# Patient Record
Sex: Female | Born: 1965 | Race: White | Hispanic: No | Marital: Single | Smoking: Never smoker
Health system: Southern US, Community
[De-identification: ages and names within clinical notes are randomized; demographics above are authoritative.]

## PROBLEM LIST (undated history)

## (undated) DIAGNOSIS — K529 Noninfective gastroenteritis and colitis, unspecified: Secondary | ICD-10-CM

## (undated) DIAGNOSIS — E785 Hyperlipidemia, unspecified: Secondary | ICD-10-CM

## (undated) DIAGNOSIS — E079 Disorder of thyroid, unspecified: Secondary | ICD-10-CM

## (undated) DIAGNOSIS — T7840XA Allergy, unspecified, initial encounter: Secondary | ICD-10-CM

## (undated) DIAGNOSIS — D649 Anemia, unspecified: Secondary | ICD-10-CM

## (undated) HISTORY — DX: Hyperlipidemia, unspecified: E78.5

## (undated) HISTORY — DX: Allergy, unspecified, initial encounter: T78.40XA

## (undated) HISTORY — DX: Disorder of thyroid, unspecified: E07.9

## (undated) HISTORY — PX: IMAGE GUIDED SINUS SURGERY: SHX6570

## (undated) HISTORY — DX: Anemia, unspecified: D64.9

## (undated) HISTORY — DX: Noninfective gastroenteritis and colitis, unspecified: K52.9

---

## 2004-11-09 ENCOUNTER — Ambulatory Visit: Payer: Self-pay | Admitting: Unknown Physician Specialty

## 2005-11-15 ENCOUNTER — Ambulatory Visit: Payer: Self-pay | Admitting: Unknown Physician Specialty

## 2006-12-09 ENCOUNTER — Ambulatory Visit: Payer: Self-pay | Admitting: Unknown Physician Specialty

## 2007-02-19 ENCOUNTER — Ambulatory Visit: Payer: Self-pay | Admitting: Family Medicine

## 2007-12-15 ENCOUNTER — Ambulatory Visit: Payer: Self-pay | Admitting: Unknown Physician Specialty

## 2008-08-16 ENCOUNTER — Ambulatory Visit: Payer: Self-pay | Admitting: Internal Medicine

## 2009-04-22 ENCOUNTER — Ambulatory Visit: Payer: Self-pay | Admitting: Unknown Physician Specialty

## 2010-08-24 ENCOUNTER — Ambulatory Visit: Payer: Self-pay | Admitting: Unknown Physician Specialty

## 2010-09-04 ENCOUNTER — Ambulatory Visit: Payer: Self-pay | Admitting: Unknown Physician Specialty

## 2011-12-26 ENCOUNTER — Ambulatory Visit: Payer: Self-pay

## 2013-08-22 ENCOUNTER — Ambulatory Visit: Payer: Self-pay | Admitting: Physician Assistant

## 2013-12-11 ENCOUNTER — Emergency Department: Payer: Self-pay | Admitting: Emergency Medicine

## 2013-12-25 DIAGNOSIS — J302 Other seasonal allergic rhinitis: Secondary | ICD-10-CM | POA: Insufficient documentation

## 2014-01-29 DIAGNOSIS — E039 Hypothyroidism, unspecified: Secondary | ICD-10-CM | POA: Insufficient documentation

## 2014-01-29 DIAGNOSIS — E7800 Pure hypercholesterolemia, unspecified: Secondary | ICD-10-CM | POA: Insufficient documentation

## 2014-02-26 ENCOUNTER — Emergency Department: Payer: Self-pay | Admitting: Emergency Medicine

## 2014-02-26 LAB — BASIC METABOLIC PANEL
Anion Gap: 1 — ABNORMAL LOW (ref 7–16)
BUN: 15 mg/dL (ref 7–18)
CALCIUM: 8.9 mg/dL (ref 8.5–10.1)
CO2: 26 mmol/L (ref 21–32)
Chloride: 110 mmol/L — ABNORMAL HIGH (ref 98–107)
Creatinine: 0.85 mg/dL (ref 0.60–1.30)
EGFR (Non-African Amer.): >60
Glucose: 86 mg/dL (ref 65–99)
Osmolality: 274 (ref 275–301)
POTASSIUM: 3.6 mmol/L (ref 3.5–5.1)
Sodium: 137 mmol/L (ref 136–145)

## 2014-02-26 LAB — CBC
HCT: 40.2 % (ref 35.0–47.0)
HGB: 13.4 g/dL (ref 12.0–16.0)
MCH: 31.1 pg (ref 26.0–34.0)
MCHC: 33.3 g/dL (ref 32.0–36.0)
MCV: 94 fL (ref 80–100)
Platelet: 289 10*3/uL (ref 150–440)
RBC: 4.3 10*6/uL (ref 3.80–5.20)
RDW: 13.1 % (ref 11.5–14.5)
WBC: 10.5 10*3/uL (ref 3.6–11.0)

## 2014-02-26 LAB — TROPONIN I
Troponin-I: <0.02 ng/mL
Troponin-I: <0.02 ng/mL

## 2014-02-26 LAB — D-DIMER(ARMC): D-DIMER: 1668 ng/mL

## 2015-02-09 ENCOUNTER — Other Ambulatory Visit: Payer: Self-pay | Admitting: Certified Nurse Midwife

## 2015-02-09 DIAGNOSIS — Z1231 Encounter for screening mammogram for malignant neoplasm of breast: Secondary | ICD-10-CM

## 2015-02-10 ENCOUNTER — Encounter: Payer: Self-pay | Admitting: *Deleted

## 2015-03-02 ENCOUNTER — Ambulatory Visit: Payer: Self-pay

## 2015-03-03 ENCOUNTER — Ambulatory Visit
Admission: RE | Admit: 2015-03-03 | Discharge: 2015-03-03 | Disposition: A | Discharge status: Home / Self Care | Payer: BC Managed Care – PPO | Source: Ambulatory Visit | Attending: Certified Nurse Midwife | Admitting: Certified Nurse Midwife

## 2015-03-03 DIAGNOSIS — Z1231 Encounter for screening mammogram for malignant neoplasm of breast: Secondary | ICD-10-CM | POA: Insufficient documentation

## 2015-03-09 ENCOUNTER — Ambulatory Visit: Payer: Self-pay | Admitting: General Surgery

## 2015-03-09 ENCOUNTER — Ambulatory Visit (INDEPENDENT_AMBULATORY_CARE_PROVIDER_SITE_OTHER): Payer: BC Managed Care – PPO | Admitting: General Surgery

## 2015-03-09 ENCOUNTER — Encounter: Payer: Self-pay | Admitting: General Surgery

## 2015-03-09 VITALS — BP 144/88 | HR 84 | Resp 16 | Ht 64.0 in | Wt 139.0 lb

## 2015-03-09 DIAGNOSIS — N6001 Solitary cyst of right breast: Secondary | ICD-10-CM

## 2015-03-09 DIAGNOSIS — N644 Mastodynia: Secondary | ICD-10-CM | POA: Diagnosis not present

## 2015-03-09 DIAGNOSIS — N6009 Solitary cyst of unspecified breast: Secondary | ICD-10-CM | POA: Insufficient documentation
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
## 2015-03-09 NOTE — Patient Instructions (Addendum)
The patient is aware to call back for any questions or concerns. Breast Self-Awareness Practicing breast self-awareness may pick up problems early, prevent significant medical complications, and possibly save your life. By practicing breast self-awareness, you can become familiar with how your breasts look and feel and if your breasts are changing. This allows you to notice changes early. It can also offer you some reassurance that your breast health is good. One way to learn what is normal for your breasts and whether your breasts are changing is to do a breast self-exam. If you find a lump or something that was not present in the past, it is best to contact your caregiver right away. Other findings that should be evaluated by your caregiver include nipple discharge, especially if it is bloody; skin changes or reddening; areas where the skin seems to be pulled in (retracted); or new lumps and bumps. Breast pain is seldom associated with cancer (malignancy), but should also be evaluated by a caregiver. HOW TO PERFORM A BREAST SELF-EXAM The best time to examine your breasts is 5-7 days after your menstrual period is over. During menstruation, the breasts are lumpier, and it may be more difficult to pick up changes. If you do not menstruate, have reached menopause, or had your uterus removed (hysterectomy), you should examine your breasts at regular intervals, such as monthly. If you are breastfeeding, examine your breasts after a feeding or after using a breast pump. Breast implants do not decrease the risk for lumps or tumors, so continue to perform breast self-exams as recommended. Talk to your caregiver about how to determine the difference between the implant and breast tissue. Also, talk about the amount of pressure you should use during the exam. Over time, you will become more familiar with the variations of your breasts and more comfortable with the exam. A breast self-exam requires you to remove all  your clothes above the waist. 1. Look at your breasts and nipples. Stand in front of a mirror in a room with good lighting. With your hands on your hips, push your hands firmly downward. Look for a difference in shape, contour, and size from one breast to the other (asymmetry). Asymmetry includes puckers, dips, or bumps. Also, look for skin changes, such as reddened or scaly areas on the breasts. Look for nipple changes, such as discharge, dimpling, repositioning, or redness. 2. Carefully feel your breasts. This is best done either in the shower or tub while using soapy water or when flat on your back. Place the arm (on the side of the breast you are examining) above your head. Use the pads (not the fingertips) of your three middle fingers on your opposite hand to feel your breasts. Start in the underarm area and use  inch (2 cm) overlapping circles to feel your breast. Use 3 different levels of pressure (light, medium, and firm pressure) at each circle before moving to the next circle. The light pressure is needed to feel the tissue closest to the skin. The medium pressure will help to feel breast tissue a little deeper, while the firm pressure is needed to feel the tissue close to the ribs. Continue the overlapping circles, moving downward over the breast until you feel your ribs below your breast. Then, move one finger-width towards the center of the body. Continue to use the  inch (2 cm) overlapping circles to feel your breast as you move slowly up toward the collar bone (clavicle) near the base of the neck. Continue the   up and down exam using all 3 pressures until you reach the middle of the chest. Do this with each breast, carefully feeling for lumps or changes. 3.  Keep a written record with breast changes or normal findings for each breast. By writing this information down, you do not need to depend only on memory for size, tenderness, or location. Write down where you are in your menstrual cycle, if  you are still menstruating. Breast tissue can have some lumps or thick tissue. However, see your caregiver if you find anything that concerns you.  SEEK MEDICAL CARE IF:  You see a change in shape, contour, or size of your breasts or nipples.   You see skin changes, such as reddened or scaly areas on the breasts or nipples.   You have an unusual discharge from your nipples.   You feel a new lump or unusually thick areas.    This information is not intended to replace advice given to you by your health care provider. Make sure you discuss any questions you have with your health care provider.   Document Released: 02/12/2005 Document Revised: 01/30/2012 Document Reviewed: 05/30/2011 Elsevier Interactive Patient Education 2016 Elsevier Inc.  

## 2016-04-18 DIAGNOSIS — R0982 Postnasal drip: Secondary | ICD-10-CM | POA: Insufficient documentation

## 2016-04-18 DIAGNOSIS — R059 Cough, unspecified: Secondary | ICD-10-CM | POA: Insufficient documentation

## 2016-08-16 DIAGNOSIS — I10 Essential (primary) hypertension: Secondary | ICD-10-CM | POA: Insufficient documentation

## 2016-08-16 DIAGNOSIS — R002 Palpitations: Secondary | ICD-10-CM | POA: Insufficient documentation

## 2016-11-27 DIAGNOSIS — J324 Chronic pansinusitis: Secondary | ICD-10-CM | POA: Insufficient documentation

## 2016-11-27 DIAGNOSIS — J45902 Unspecified asthma with status asthmaticus: Secondary | ICD-10-CM | POA: Insufficient documentation

## 2017-06-27 ENCOUNTER — Other Ambulatory Visit: Payer: Self-pay | Admitting: Family Medicine

## 2017-06-27 DIAGNOSIS — Z803 Family history of malignant neoplasm of breast: Secondary | ICD-10-CM

## 2017-06-27 DIAGNOSIS — Z1239 Encounter for other screening for malignant neoplasm of breast: Secondary | ICD-10-CM

## 2018-01-21 ENCOUNTER — Other Ambulatory Visit: Payer: Self-pay | Admitting: Family Medicine

## 2018-01-21 DIAGNOSIS — Z1231 Encounter for screening mammogram for malignant neoplasm of breast: Secondary | ICD-10-CM

## 2018-12-09 ENCOUNTER — Other Ambulatory Visit: Payer: Self-pay | Admitting: Family Medicine

## 2019-01-08 ENCOUNTER — Ambulatory Visit: Payer: BC Managed Care – PPO

## 2019-01-21 ENCOUNTER — Other Ambulatory Visit: Payer: Self-pay

## 2019-01-21 ENCOUNTER — Encounter (INDEPENDENT_AMBULATORY_CARE_PROVIDER_SITE_OTHER): Payer: Self-pay

## 2019-01-21 ENCOUNTER — Ambulatory Visit
Admission: RE | Admit: 2019-01-21 | Discharge: 2019-01-21 | Disposition: A | Discharge status: Home / Self Care | Payer: BC Managed Care – PPO | Source: Ambulatory Visit | Attending: Family Medicine | Admitting: Family Medicine

## 2019-01-21 DIAGNOSIS — Z1231 Encounter for screening mammogram for malignant neoplasm of breast: Secondary | ICD-10-CM | POA: Diagnosis present

## 2019-12-11 ENCOUNTER — Other Ambulatory Visit: Payer: Self-pay | Admitting: Family Medicine

## 2019-12-11 DIAGNOSIS — Z1231 Encounter for screening mammogram for malignant neoplasm of breast: Secondary | ICD-10-CM

## 2020-01-27 ENCOUNTER — Ambulatory Visit
Admission: RE | Admit: 2020-01-27 | Discharge: 2020-01-27 | Disposition: A | Discharge status: Home / Self Care | Payer: BC Managed Care – PPO | Source: Ambulatory Visit | Attending: Family Medicine | Admitting: Family Medicine

## 2020-01-27 ENCOUNTER — Other Ambulatory Visit: Payer: Self-pay

## 2020-01-27 DIAGNOSIS — Z1231 Encounter for screening mammogram for malignant neoplasm of breast: Secondary | ICD-10-CM | POA: Diagnosis not present

## 2020-02-04 ENCOUNTER — Other Ambulatory Visit: Payer: Self-pay | Admitting: Family Medicine

## 2020-02-04 DIAGNOSIS — R921 Mammographic calcification found on diagnostic imaging of breast: Secondary | ICD-10-CM

## 2020-02-04 DIAGNOSIS — R928 Other abnormal and inconclusive findings on diagnostic imaging of breast: Secondary | ICD-10-CM

## 2020-02-16 ENCOUNTER — Ambulatory Visit
Admission: RE | Admit: 2020-02-16 | Discharge: 2020-02-16 | Disposition: A | Discharge status: Home / Self Care | Payer: BC Managed Care – PPO | Source: Ambulatory Visit | Attending: Family Medicine | Admitting: Family Medicine

## 2020-02-16 ENCOUNTER — Other Ambulatory Visit: Payer: Self-pay

## 2020-02-16 DIAGNOSIS — R928 Other abnormal and inconclusive findings on diagnostic imaging of breast: Secondary | ICD-10-CM

## 2020-02-16 DIAGNOSIS — R921 Mammographic calcification found on diagnostic imaging of breast: Secondary | ICD-10-CM

## 2020-03-07 ENCOUNTER — Other Ambulatory Visit: Payer: Self-pay | Admitting: Family Medicine

## 2020-03-07 DIAGNOSIS — R928 Other abnormal and inconclusive findings on diagnostic imaging of breast: Secondary | ICD-10-CM

## 2020-03-07 DIAGNOSIS — R921 Mammographic calcification found on diagnostic imaging of breast: Secondary | ICD-10-CM

## 2020-03-30 ENCOUNTER — Ambulatory Visit: Payer: BC Managed Care – PPO

## 2020-04-05 ENCOUNTER — Other Ambulatory Visit: Payer: Self-pay

## 2020-04-05 ENCOUNTER — Ambulatory Visit
Admission: RE | Admit: 2020-04-05 | Discharge: 2020-04-05 | Disposition: A | Discharge status: Home / Self Care | Payer: BC Managed Care – PPO | Source: Ambulatory Visit | Attending: Family Medicine | Admitting: Family Medicine

## 2020-04-05 DIAGNOSIS — R921 Mammographic calcification found on diagnostic imaging of breast: Secondary | ICD-10-CM | POA: Diagnosis present

## 2020-04-05 DIAGNOSIS — R928 Other abnormal and inconclusive findings on diagnostic imaging of breast: Secondary | ICD-10-CM | POA: Insufficient documentation

## 2020-04-05 HISTORY — PX: BREAST BIOPSY: SHX20

## 2020-04-07 LAB — SURGICAL PATHOLOGY

## 2020-05-03 DIAGNOSIS — N6092 Unspecified benign mammary dysplasia of left breast: Secondary | ICD-10-CM | POA: Insufficient documentation

## 2020-05-23 ENCOUNTER — Ambulatory Visit: Payer: BC Managed Care – PPO | Admitting: Gastroenterology

## 2020-05-23 ENCOUNTER — Other Ambulatory Visit: Payer: Self-pay

## 2020-05-23 ENCOUNTER — Encounter: Payer: Self-pay | Admitting: Gastroenterology

## 2020-05-23 VITALS — BP 131/88 | HR 84 | Temp 98.1°F | Ht 64.0 in | Wt 116.1 lb

## 2020-05-23 DIAGNOSIS — K5909 Other constipation: Secondary | ICD-10-CM

## 2020-05-23 DIAGNOSIS — R194 Change in bowel habit: Secondary | ICD-10-CM

## 2020-05-23 MED ORDER — MAGNESIUM CITRATE PO SOLN: 1.0000 | Freq: Once | ORAL | 0 refills | Status: AC

## 2020-05-23 MED ORDER — CLENPIQ 10-3.5-12 MG-GM -GM/160ML PO SOLN: 320.0000 mL | Freq: Once | ORAL | 0 refills | Status: AC
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
## 2020-07-01 ENCOUNTER — Other Ambulatory Visit: Payer: Self-pay

## 2020-07-01 ENCOUNTER — Encounter: Payer: Self-pay | Admitting: Gastroenterology

## 2020-07-04 ENCOUNTER — Encounter: Payer: Self-pay | Admitting: Anesthesiology

## 2020-07-06 ENCOUNTER — Ambulatory Visit: Payer: BC Managed Care – PPO | Admitting: Gastroenterology

## 2020-07-12 ENCOUNTER — Telehealth: Payer: Self-pay | Admitting: Gastroenterology

## 2020-07-12 ENCOUNTER — Telehealth: Payer: Self-pay

## 2020-07-12 NOTE — Telephone Encounter (Signed)
Tried to call patient 4 times but it said call can not be completed at this time please hang up and try again later. Patient is not set up for mychart so unable to send a [employer]. Please advised on prep

## 2020-07-12 NOTE — Telephone Encounter (Signed)
Kim with the ENDO unit in [facility] sent me a message though epic stating the patient called there to cancel her colonoscopy for 07/14/2020. She states that she has left 3 messages for our office but no one has called her back. This is the first message I have got for her. Documented in referral

## 2020-07-12 NOTE — Telephone Encounter (Signed)
Patient lvm that she is having muscle spasms. She just started taking tamoxifen (NOLVADEX) 10 MG tablet and/or low sodium and is worried about bowel prepping for her colonoscopy. Please advise patient.

## 2020-07-12 NOTE — Telephone Encounter (Signed)
I see that her sodium levels were normal in the past.  So, I do not anticipate any issues with her bowel prep.  Advise her to stay hydrated with Gatorade and lots of fluids during bowel prep  RV

## 2020-07-12 NOTE — Telephone Encounter (Signed)
Patient states her oncologist Is going to order some lab work and she is going to make sure everything is normal. They think it is just a side effect to the medication. She states she will give [place] a call back when she decides to rescheduled the procedure

## 2020-07-12 NOTE — Telephone Encounter (Signed)
Have documented this in another telephone call

## 2020-07-12 NOTE — Telephone Encounter (Signed)
Per [place] Patient lvm that she is having muscle spasms. She just started taking tamoxifen (NOLVADEX) 10 MG tablet and/or low sodium and is worried about bowel prepping for her colonoscopy. Please advise patient.

## 2020-07-12 NOTE — Telephone Encounter (Signed)
Called and left a message for call back  

## 2020-07-14 ENCOUNTER — Ambulatory Visit
Admission: RE | Admit: 2020-07-14 | Payer: BC Managed Care – PPO | Source: Home / Self Care | Admitting: Gastroenterology

## 2020-07-14 SURGERY — COLONOSCOPY WITH PROPOFOL
Anesthesia: Choice

## 2020-08-08 ENCOUNTER — Other Ambulatory Visit: Payer: Self-pay

## 2020-08-08 ENCOUNTER — Telehealth: Payer: Self-pay

## 2020-08-08 DIAGNOSIS — R194 Change in bowel habit: Secondary | ICD-10-CM

## 2020-08-08 NOTE — Telephone Encounter (Signed)
Patient called and left a voice to reschedule her colonoscopy. Called and left a message for patient to give [place] a call back to get the procedure reschedule

## 2020-09-01 ENCOUNTER — Encounter: Payer: Self-pay | Admitting: Gastroenterology

## 2020-09-12 ENCOUNTER — Telehealth: Payer: Self-pay

## 2020-09-12 NOTE — Telephone Encounter (Signed)
Patient called and left a voicemail and asked if she needed to have a covid test before her procedure and if she could eat canned pineapple and chicken today and tomorrow. Informed patient she can eat both and she verbalized understanding

## 2020-09-15 ENCOUNTER — Ambulatory Visit: Payer: BC Managed Care – PPO | Admitting: Anesthesiology

## 2020-09-15 ENCOUNTER — Ambulatory Visit
Admission: RE | Admit: 2020-09-15 | Discharge: 2020-09-15 | Disposition: A | Discharge status: Home / Self Care | Payer: BC Managed Care – PPO | Attending: Gastroenterology | Admitting: Gastroenterology

## 2020-09-15 ENCOUNTER — Encounter
Admission: RE | Disposition: A | Discharge status: Home / Self Care | Payer: Self-pay | Source: Home / Self Care | Attending: Gastroenterology

## 2020-09-15 ENCOUNTER — Other Ambulatory Visit: Payer: Self-pay

## 2020-09-15 DIAGNOSIS — R194 Change in bowel habit: Secondary | ICD-10-CM | POA: Insufficient documentation

## 2020-09-15 DIAGNOSIS — Z79890 Hormone replacement therapy (postmenopausal): Secondary | ICD-10-CM | POA: Diagnosis not present

## 2020-09-15 HISTORY — PX: COLONOSCOPY WITH PROPOFOL: SHX5780

## 2020-09-15 LAB — POCT PREGNANCY, URINE: Preg Test, Ur: NEGATIVE

## 2020-09-15 SURGERY — COLONOSCOPY WITH PROPOFOL
Anesthesia: General | Site: Rectum

## 2020-09-15 MED ORDER — HYDROCORTISONE (PERIANAL) 2.5 % EX CREA: TOPICAL_CREAM | Freq: Three times a day (TID) | CUTANEOUS | 1 refills | Status: AC

## 2020-09-15 MED ORDER — SODIUM CHLORIDE 0.9 % IV SOLN: INTRAVENOUS | Status: DC

## 2020-09-15 MED ORDER — STERILE WATER FOR IRRIGATION IR SOLN
Status: DC | PRN
  Administered 2020-09-15: .05 mL

## 2020-09-15 MED ORDER — ACETAMINOPHEN 325 MG PO TABS: 325.0000 mg | ORAL_TABLET | Freq: Once | ORAL | Status: DC

## 2020-09-15 MED ORDER — PROPOFOL 10 MG/ML IV BOLUS
INTRAVENOUS | Status: DC | PRN
  Administered 2020-09-15: 20 mg via INTRAVENOUS
  Administered 2020-09-15: 80 mg via INTRAVENOUS
  Administered 2020-09-15 (×2): 20 mg via INTRAVENOUS
  Administered 2020-09-15: 30 mg via INTRAVENOUS
  Administered 2020-09-15: 20 mg via INTRAVENOUS

## 2020-09-15 MED ORDER — LACTATED RINGERS IV SOLN: INTRAVENOUS | Status: DC

## 2020-09-15 MED ORDER — ACETAMINOPHEN 160 MG/5ML PO SOLN: 325.0000 mg | Freq: Once | ORAL | Status: DC

## 2020-09-15 SURGICAL SUPPLY — 6 items
GOWN CVR UNV OPN BCK APRN NK (MISCELLANEOUS) ×2 IMPLANT
GOWN ISOL THUMB LOOP REG UNIV (MISCELLANEOUS) ×4
KIT PRC NS LF DISP ENDO (KITS) ×1 IMPLANT
KIT PROCEDURE OLYMPUS (KITS) ×2
MANIFOLD NEPTUNE II (INSTRUMENTS) ×2 IMPLANT
WATER STERILE IRR 250ML POUR (IV SOLUTION) ×2 IMPLANT

## 2020-09-15 NOTE — Op Note (Signed)
[facility] Gastroenterology Patient Name: Brenda Dominguez Procedure Date: 09/15/2020 10:18 AM MRN: [id number] Account #: [id number] Date of Birth: 1965/04/15 Admit Type: Outpatient Age: 55 Room: [facility] OR ROOM 01 Gender: Female Note Status: Finalized Procedure:             Colonoscopy Indications:           Change in bowel habits Providers:             Toney Reil MD, MD Medicines:             General Anesthesia Complications:         No immediate complications. Estimated blood loss: None. Procedure:             Pre-Anesthesia Assessment:                        - Prior to the procedure, a History and Physical was                         performed, and patient medications and allergies were                         reviewed. The patient is competent. The risks and                         benefits of the procedure and the sedation options and                         risks were discussed with the patient. All questions                         were answered and informed consent was obtained.                         Patient identification and proposed procedure were                         verified by the physician, the nurse, the                         anesthesiologist, the anesthetist and the technician                         in the pre-procedure area in the procedure room in the                         endoscopy suite. Mental Status Examination: alert and                         oriented. Airway Examination: normal oropharyngeal                         airway and neck mobility. Respiratory Examination:                         clear to auscultation. CV Examination: normal.                         Prophylactic Antibiotics: The patient does not require  prophylactic antibiotics. Prior Anticoagulants: The                         patient has taken no previous anticoagulant or                         antiplatelet agents. ASA Grade Assessment: II  - A                         patient with mild systemic disease. After reviewing                         the risks and benefits, the patient was deemed in                         satisfactory condition to undergo the procedure. The                         anesthesia plan was to use general anesthesia.                         Immediately prior to administration of medications,                         the patient was re-assessed for adequacy to receive                         sedatives. The heart rate, respiratory rate, oxygen                         saturations, blood pressure, adequacy of pulmonary                         ventilation, and response to care were monitored                         throughout the procedure. The physical status of the                         patient was re-assessed after the procedure.                        After obtaining informed consent, the colonoscope was                         passed under direct vision. Throughout the procedure,                         the patient's blood pressure, pulse, and oxygen                         saturations were monitored continuously. The                         Colonoscope was introduced through the anus and                         advanced to the the terminal ileum, with  identification of the appendiceal orifice and IC                         valve. The colonoscopy was performed without                         difficulty. The patient tolerated the procedure well.                         The quality of the bowel preparation was evaluated                         using the BBPS [facility] Bowel Preparation Scale) with                         scores of: Right Colon = 3, Transverse Colon = 3 and                         Left Colon = 3 (entire mucosa seen well with no                         residual staining, small fragments of stool or opaque                         liquid). The total BBPS score equals  9. Findings:      The perianal and digital rectal examinations were normal. Pertinent       negatives include normal sphincter tone and no palpable rectal lesions.      The entire examined colon appeared normal.      The terminal ileum appeared normal.      The retroflexed view of the distal rectum and anal verge was normal and       showed no anal or rectal abnormalities. Impression:            - The entire examined colon is normal.                        - The examined portion of the ileum was normal.                        - The distal rectum and anal verge are normal on                         retroflexion view.                        - No specimens collected. Recommendation:        - Discharge patient to home (with escort).                        - Resume regular diet and high fiber diet today.                        - Continue present medications.                        - Repeat colonoscopy in 10 years for screening  purposes. Procedure Code(s):     --- Professional ---                        [phone], Colonoscopy, flexible; diagnostic, including                         collection of specimen(s) by brushing or washing, when                         performed (separate procedure) Diagnosis Code(s):     --- Professional ---                        R19.4, Change in bowel habit CPT copyright 2019 American Medical Association. All rights reserved. The codes documented in this report are preliminary and upon coder review may  be revised to meet current compliance requirements. Dr. Libby Maw Toney Reil MD, MD 09/15/2020 11:01:18 AM This report has been signed electronically. Number of Addenda: 0 Note Initiated On: 09/15/2020 10:18 AM Scope Withdrawal Time: 0 hours 7 minutes 42 seconds  Total Procedure Duration: 0 hours 10 minutes 15 seconds  Estimated Blood Loss:  Estimated blood loss: none.      [facility]

## 2020-09-15 NOTE — Anesthesia Procedure Notes (Signed)
Procedure Name: MAC Date/Time: 09/15/2020 10:41 AM Performed by: Vanetta Shawl, CRNA Pre-anesthesia Checklist: Patient identified, Emergency Drugs available, Suction available, Timeout performed and Patient being monitored Patient Re-evaluated:Patient Re-evaluated prior to induction Oxygen Delivery Method: Nasal cannula Placement Confirmation: positive ETCO2

## 2020-09-15 NOTE — Anesthesia Preprocedure Evaluation (Signed)
Anesthesia Evaluation  Patient identified by MRN, date of birth, ID band Patient awake    Reviewed: Allergy & Precautions, H&P , NPO status , Patient's Chart, lab work & pertinent test results  Airway Mallampati: II  TM Distance: >3 FB Neck ROM: full    Dental no notable dental hx.    Pulmonary    Pulmonary exam normal breath sounds clear to auscultation       Cardiovascular Normal cardiovascular exam Rhythm:regular Rate:Normal     Neuro/Psych    GI/Hepatic   Endo/Other  Hypothyroidism   Renal/GU      Musculoskeletal   Abdominal   Peds  Hematology   Anesthesia Other Findings   Reproductive/Obstetrics                             Anesthesia Physical Anesthesia Plan  ASA: 2  Anesthesia Plan: General   Post-op Pain Management:    Induction: Intravenous  PONV Risk Score and Plan: 3 and Treatment may vary due to age or medical condition, Propofol infusion and TIVA  Airway Management Planned: Natural Airway  Additional Equipment:   Intra-op Plan:   Post-operative Plan:   Informed Consent: I have reviewed the patients History and Physical, chart, labs and discussed the procedure including the risks, benefits and alternatives for the proposed anesthesia with the patient or authorized representative who has indicated his/her understanding and acceptance.     Dental Advisory Given  Plan Discussed with: CRNA  Anesthesia Plan Comments:         Anesthesia Quick Evaluation  

## 2020-09-15 NOTE — Transfer of Care (Signed)
Immediate Anesthesia Transfer of Care Note  Patient: Brenda Dominguez  Procedure(s) Performed: COLONOSCOPY WITH PROPOFOL (Rectum)  Patient Location: PACU  Anesthesia Type: General  Level of Consciousness: awake, alert  and patient cooperative  Airway and Oxygen Therapy: Patient Spontanous Breathing   Post-op Assessment: Post-op Vital signs reviewed, Patient's Cardiovascular Status Stable, Respiratory Function Stable, Patent Airway and No signs of Nausea or vomiting  Post-op Vital Signs: Reviewed and stable  Complications: No notable events documented.

## 2020-09-15 NOTE — Anesthesia Postprocedure Evaluation (Signed)
Anesthesia Post Note  Patient: Brenda Dominguez  Procedure(s) Performed: COLONOSCOPY WITH PROPOFOL (Rectum)     Patient location during evaluation: PACU Anesthesia Type: General Level of consciousness: awake and alert and oriented Pain management: satisfactory to patient Vital Signs Assessment: post-procedure vital signs reviewed and stable Respiratory status: spontaneous breathing, nonlabored ventilation and respiratory function stable Cardiovascular status: blood pressure returned to baseline and stable Postop Assessment: Adequate PO intake and No signs of nausea or vomiting Anesthetic complications: no   No notable events documented.  Cherly Beach
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
## 2020-09-16 ENCOUNTER — Encounter: Payer: Self-pay | Admitting: Gastroenterology

## 2021-08-22 IMAGING — MG MM BREAST BX W LOC DEV 1ST LESION IMAGE BX SPEC STEREO GUIDE*L*
6 series · 6 of 6 positions shown · non-contrast
Comparison: Previous exams.
COMPARISON: Previous exams.

Addendum:
CLINICAL DATA: 55-year-old female presenting for stereotactic
biopsy of left breast calcifications.

EXAM:
LEFT BREAST STEREOTACTIC CORE NEEDLE BIOPSY

[L (1 of 6)]
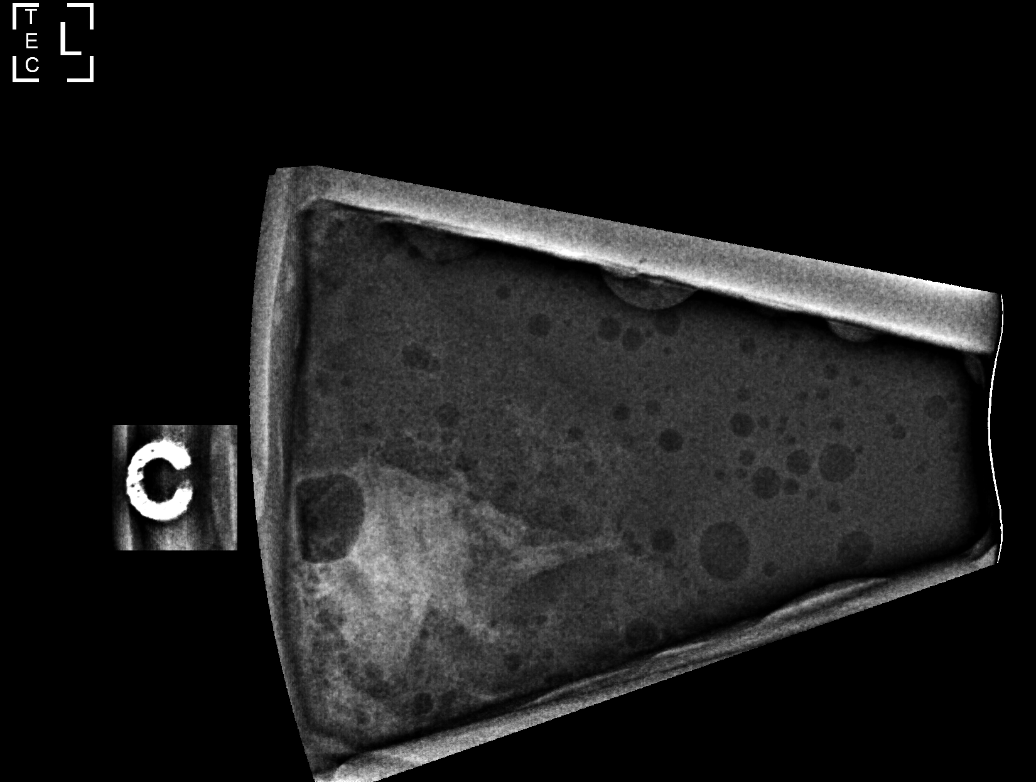

[L (2 of 6)]
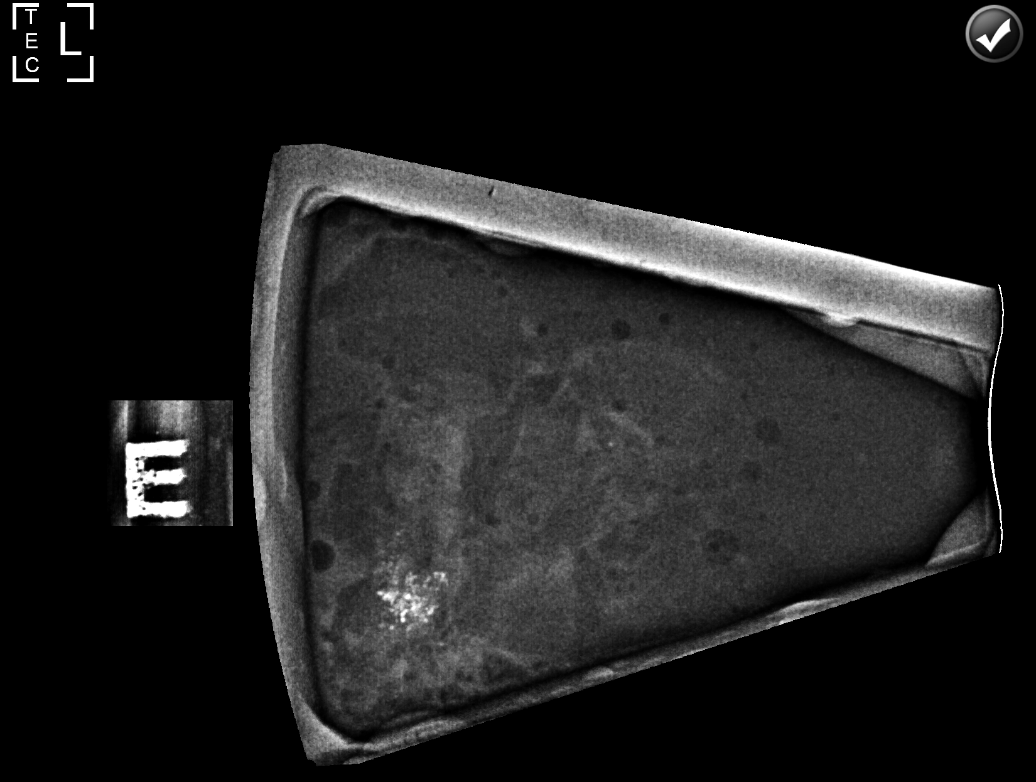

[L (3 of 6)]
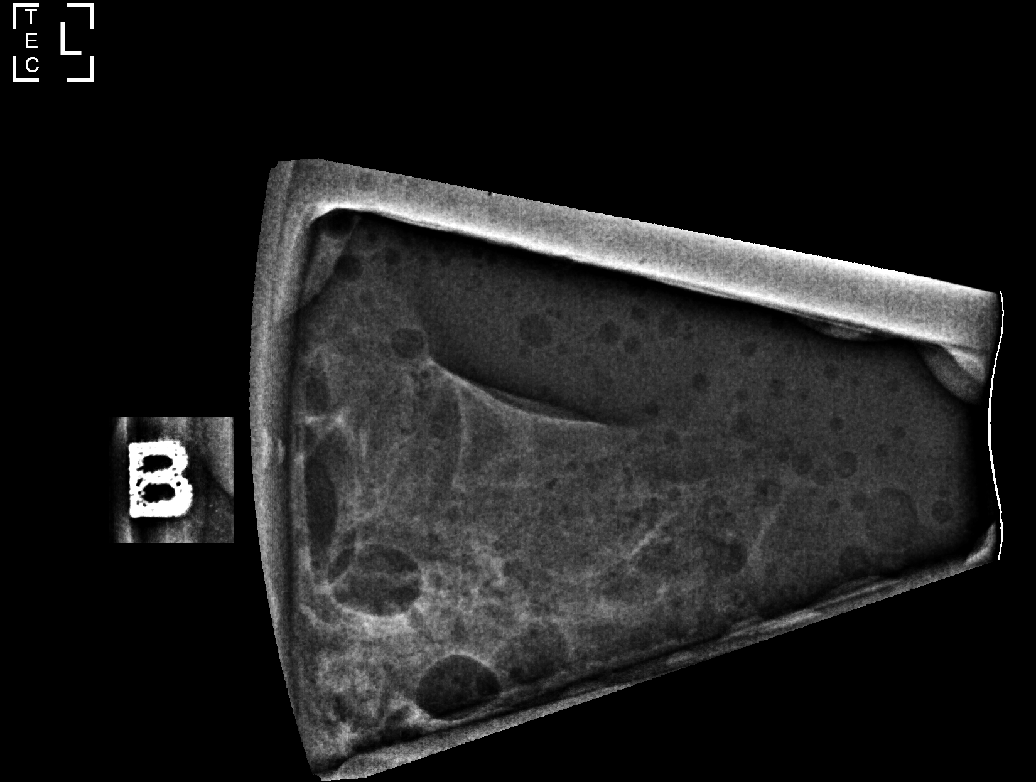

[L (4 of 6)]
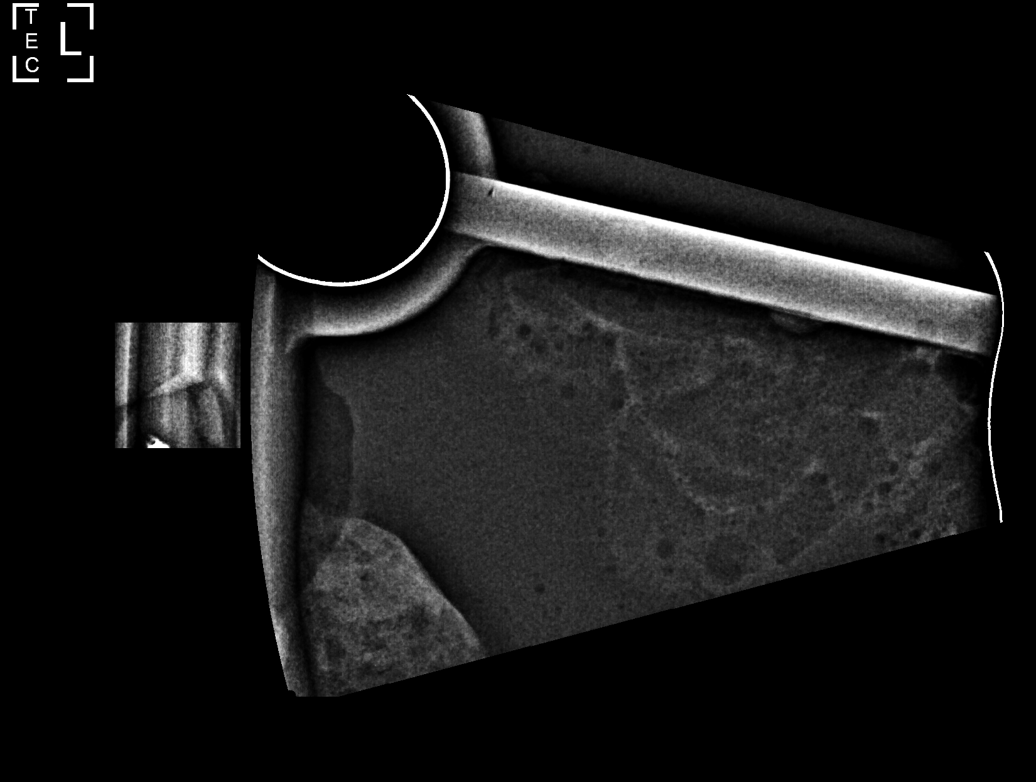

[L (5 of 6)]
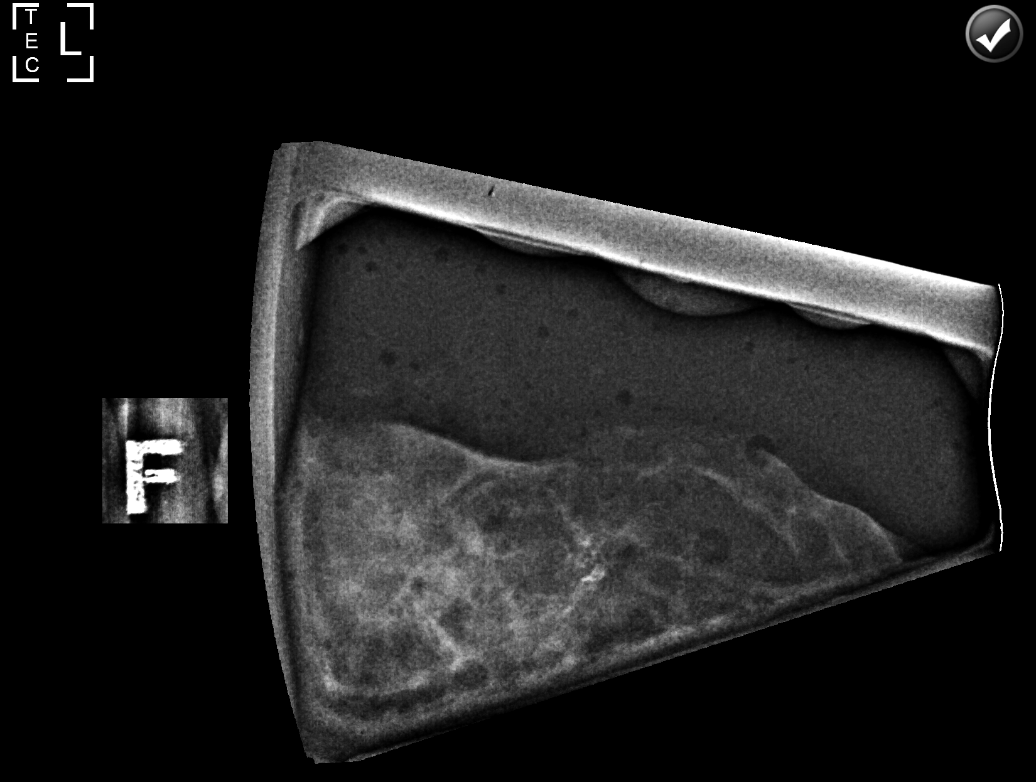

[L (6 of 6)]
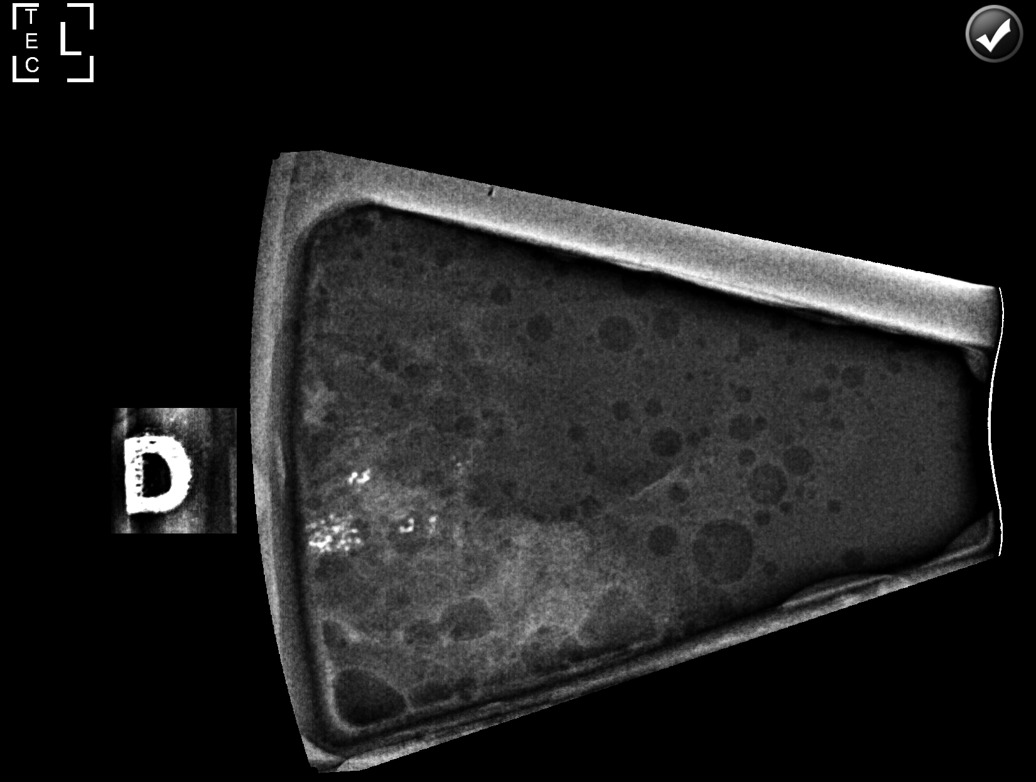

[6 of 6 positions shown; findings below may reference images not displayed]



Using sterile technique and 1% Lidocaine as local anesthetic, under
stereotactic guidance, a 9 gauge vacuum assisted device was used to
perform core needle biopsy of calcifications in the upper-outer left
breast using a lateral approach. Specimen radiograph was performed
showing calcifications in multiple core samples. Specimens with
calcifications are identified for pathology.

Lesion quadrant: Upper outer quadrant

At the conclusion of the procedure, an x shaped tissue marker clip
was deployed into the biopsy cavity. Follow-up 2-view mammogram was
performed and dictated separately.
IMPRESSION: Stereotactic-guided biopsy of calcifications in the upper-outer left
breast. No apparent complications.

ADDENDUM:
PATHOLOGY revealed: A. BREAST, LEFT UPPER OUTER QUADRANT;
STEREOTACTIC CORE NEEDLE BIOPSY: - ATYPICAL DUCTAL HYPERPLASIA, WITH
ASSOCIATED CALCIFICATIONS. - BACKGROUND BENIGN MAMMARY PARENCHYMA
WITH FIBROCYSTIC AND FIBROADENOMATOID CHANGES. - NEGATIVE FOR
CARCINOMA IN SITU AND MALIGNANCY. Comment: Due to the appearance of
cribriform ductal hyperplasia, partially obscured by calcifications,
immunohistochemical studies directed against CK[DATE] and ER were
performed. These studies show ductal epithelial cells with aberrant
loss of CK[DATE] expression, and diffuse staining for ER. The findings
are compatible with atypical ductal hyperplasia. ADH is present in 3
of 6 tissue blocks. The presence of more extensive disease, or a
higher grade process, cannot be entirely excluded, and definitive
classification is deferred to final resection.

Pathology results are CONCORDANT with imaging findings, per Dr.
Yuchi Hofer.

Pathology results and recommendations below were discussed with
patient by telephone on 04/07/2020. Patient reported biopsy site
within normal limits, with the exception of "tissue swelling around
the biopsied area where the tape was applied." After assessing
patient's concern on 04/06/2020 and again on 04/07/2020, I instructed
her to call [HOSPITAL] and return there for an
evaluation of LEFT breast biopsied area of concern. I provided
patient with my phone number and [HOSPITAL] phone
number. Patient stated she will return to [HOSPITAL] for
evaluation today (04/07/2020).

Recommend surgical consultation: Request for surgical consultation
relayed to Chikhawi Melilla RN and Dat Thao RN at [HOSPITAL]
[HOSPITAL] by Eri Erajn Ailed RN on 04/07/2020.

Pathology results reported by Eri Erajn Ailed RN on 04/07/2020.



Using sterile technique and 1% Lidocaine as local anesthetic, under
stereotactic guidance, a 9 gauge vacuum assisted device was used to
perform core needle biopsy of calcifications in the upper-outer left
breast using a lateral approach. Specimen radiograph was performed
showing calcifications in multiple core samples. Specimens with
calcifications are identified for pathology.

Lesion quadrant: Upper outer quadrant

At the conclusion of the procedure, an x shaped tissue marker clip
was deployed into the biopsy cavity. Follow-up 2-view mammogram was
performed and dictated separately.
IMPRESSION: Stereotactic-guided biopsy of calcifications in the upper-outer left
breast. No apparent complications.

## 2022-01-24 ENCOUNTER — Other Ambulatory Visit: Payer: Self-pay | Admitting: Otolaryngology

## 2022-01-24 DIAGNOSIS — R430 Anosmia: Secondary | ICD-10-CM

## 2022-05-24 ENCOUNTER — Ambulatory Visit
Admission: RE | Admit: 2022-05-24 | Discharge: 2022-05-24 | Disposition: A | Discharge status: Home / Self Care | Payer: BC Managed Care – PPO | Source: Ambulatory Visit | Attending: Otolaryngology | Admitting: Otolaryngology

## 2022-05-24 DIAGNOSIS — R430 Anosmia: Secondary | ICD-10-CM

## 2022-05-24 MED ORDER — GADOPICLENOL 0.5 MMOL/ML IV SOLN
5.0000 mL | Freq: Once | INTRAVENOUS | Status: AC | PRN
  Administered 2022-05-24: 5 mL via INTRAVENOUS

## 2022-07-25 ENCOUNTER — Ambulatory Visit
Admission: EM | Admit: 2022-07-25 | Discharge: 2022-07-25 | Disposition: A | Discharge status: Home / Self Care | Payer: BC Managed Care – PPO

## 2022-07-25 DIAGNOSIS — H01134 Eczematous dermatitis of left upper eyelid: Secondary | ICD-10-CM | POA: Diagnosis not present

## 2022-07-25 NOTE — Discharge Instructions (Signed)
-  Rash is consistent with allergy type rash.  It is not really consistent with blepharitis or any type infection. - Continue with your antihistamines.  Consider an allergy/redness relief eyedrop with your eye feels irritated.  May also use cool compresses. -May mix a tiny bit of hydrocortisone cream with a moisturizer and apply sparingly to areas of rash on your forehead and eyelid.

## 2022-07-25 NOTE — ED Triage Notes (Signed)
Pt presents to UC for redness across crease of RT eye lid x1 week ago, itchy. Pt states she has noticed discomfort in RT eye when waking up in the mornings, pt states she has had blepharitis before in other eye before.
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
## 2023-02-16 ENCOUNTER — Ambulatory Visit
Admission: EM | Admit: 2023-02-16 | Discharge: 2023-02-16 | Disposition: A | Discharge status: Home / Self Care | Payer: BC Managed Care – PPO | Attending: Family Medicine | Admitting: Family Medicine

## 2023-02-16 ENCOUNTER — Encounter: Payer: Self-pay | Admitting: Emergency Medicine

## 2023-02-16 DIAGNOSIS — J329 Chronic sinusitis, unspecified: Secondary | ICD-10-CM

## 2023-02-16 MED ORDER — IPRATROPIUM BROMIDE 0.06 % NA SOLN: 2.0000 | Freq: Four times a day (QID) | NASAL | 0 refills | Status: AC | PRN

## 2023-02-16 NOTE — Discharge Instructions (Signed)
Medication as prescribed.  Take care  Dr. Tanielle Emigh  
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
## 2023-02-16 NOTE — ED Triage Notes (Signed)
Patient reports nasal congestion, sinus pain and pressure that started on Monday.  Patient unsure of fevers.
